# Patient Record
Sex: Male | Born: 1995 | Race: White | Hispanic: No | Marital: Single | Smoking: Former smoker
Health system: Southern US, Community
[De-identification: ages and names within clinical notes are randomized; demographics above are authoritative.]

---
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
## 2014-06-04 IMAGING — CT CT MAXILLOFACIAL WITHOUT CONTRAST
1 of 2 series · 14 of 30 positions shown, 18 images · non-contrast
Comparison: none

REASON FOR EXAM: orbital fracture
COMMENTS:

[Series 8: (id) · axial · 0.38mm/px · z∈[-239,-99]mm · 14 of 85 slices shown, 18 images]
[im 5/85  brain]
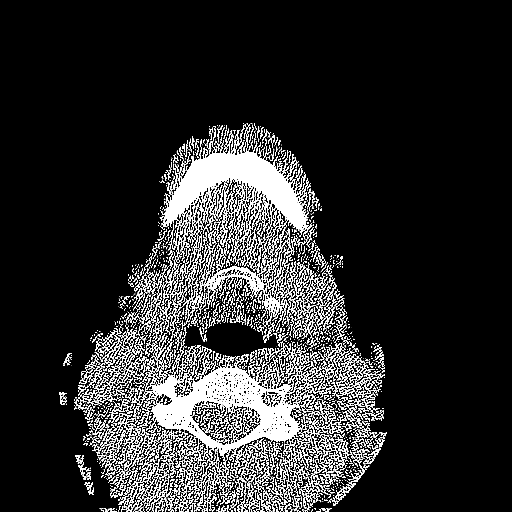
[im 5/85  bone]
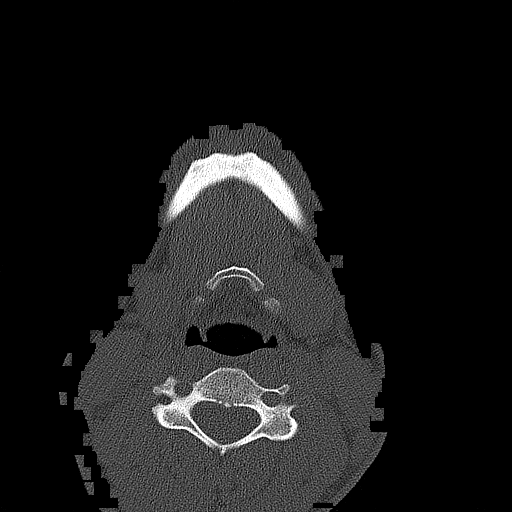
[im 10/85  bone]
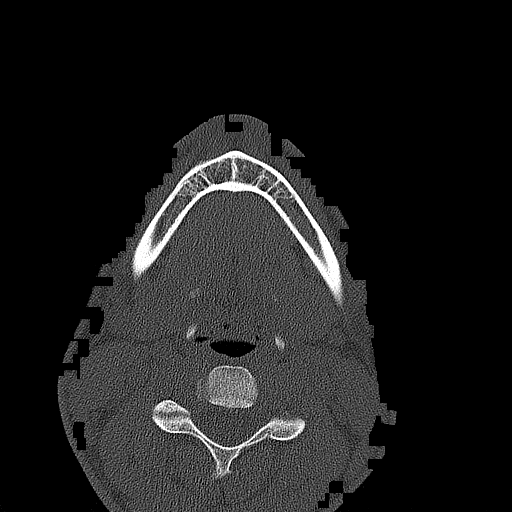
[im 15/85  bone]
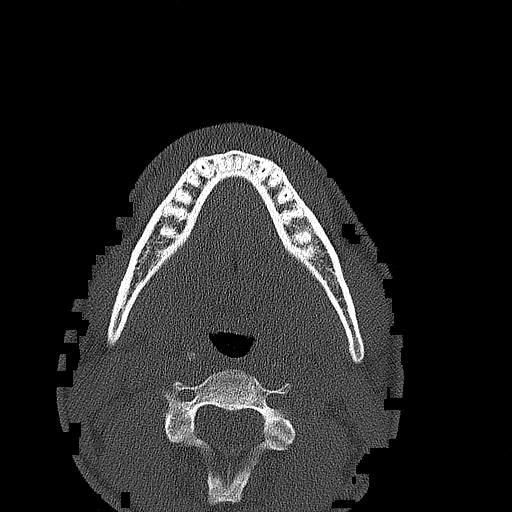
[im 20/85  bone]
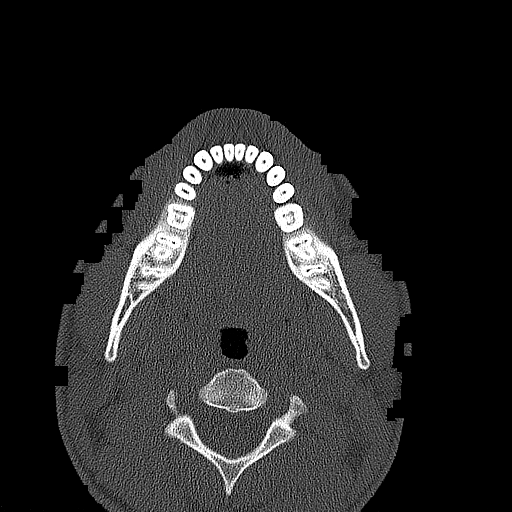
[im 25/85  brain]
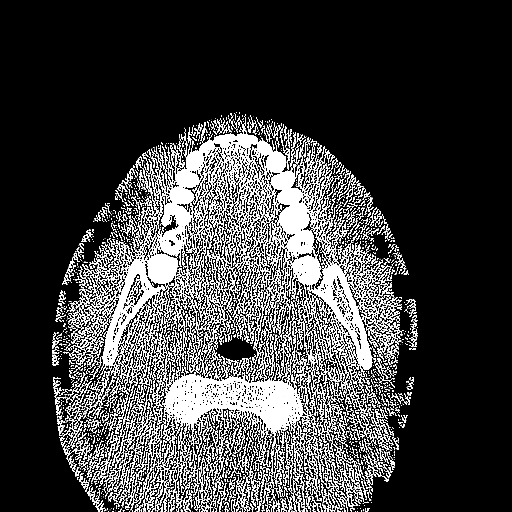
[im 25/85  bone]
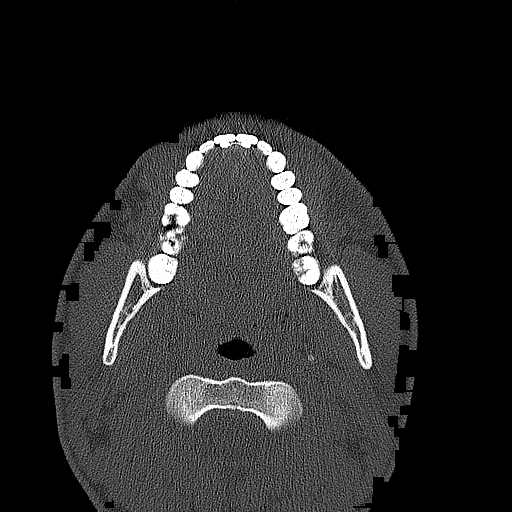
[im 30/85  bone]
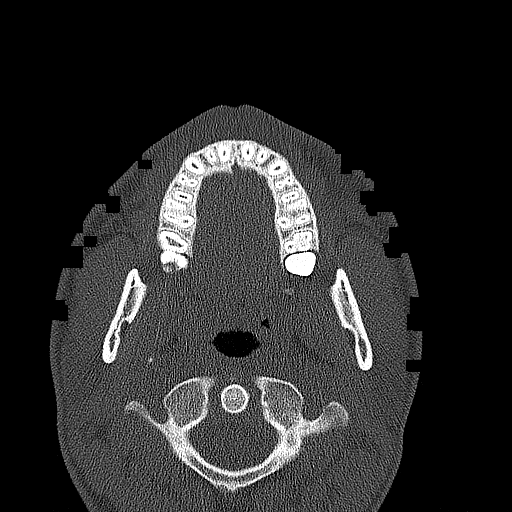
[im 35/85  bone]
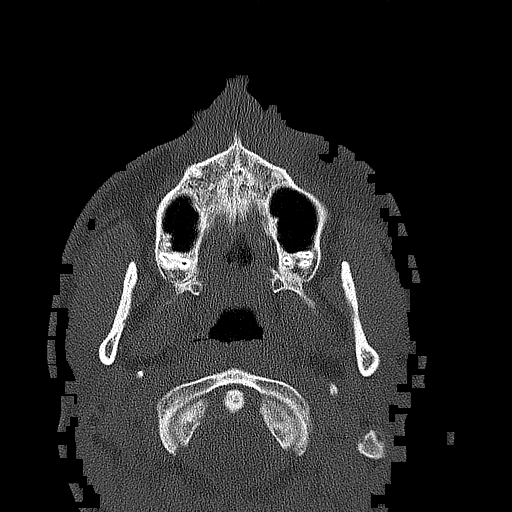
[im 40/85  bone]
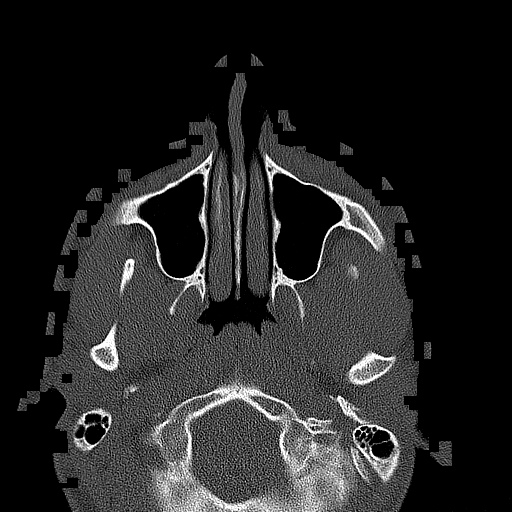
[im 45/85  brain]
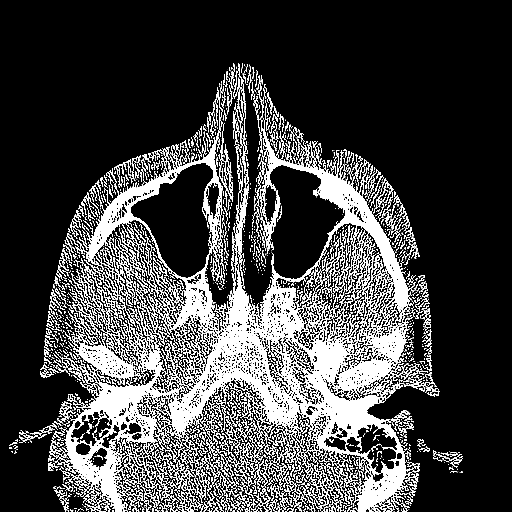
[im 45/85  bone]
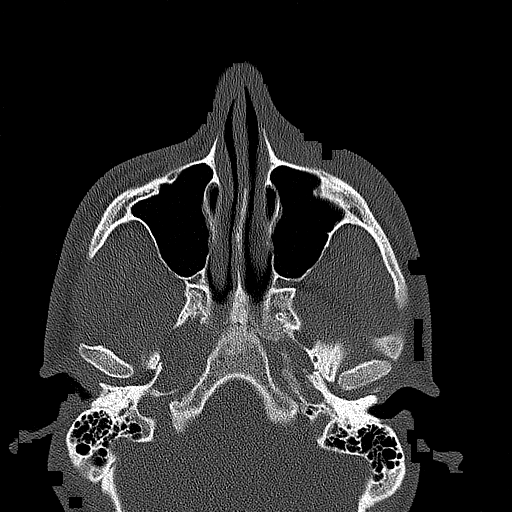
[im 50/85  bone]
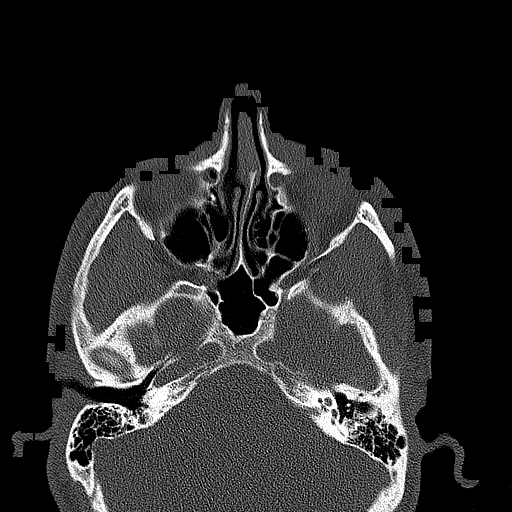
[im 55/85  bone]
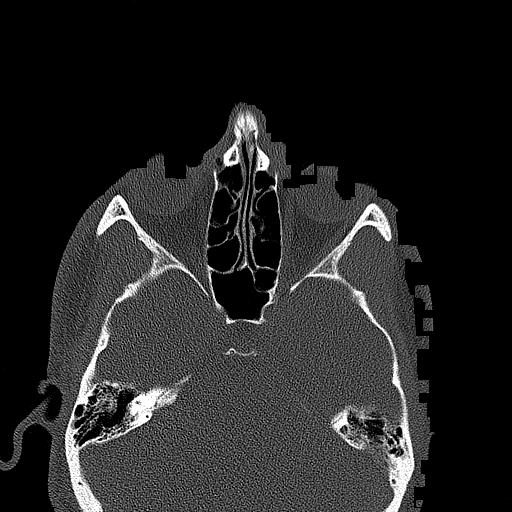
[im 65/85  bone]
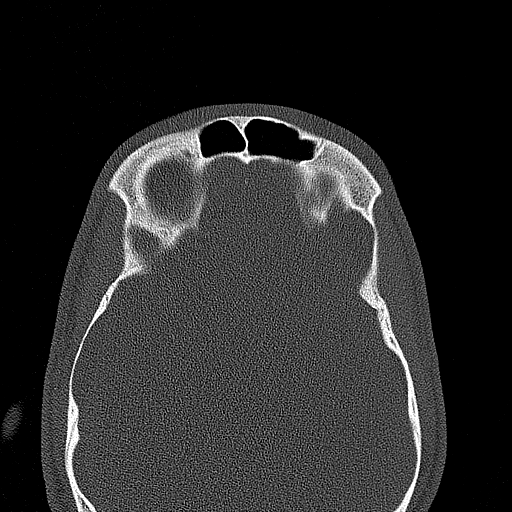
[im 70/85  brain]
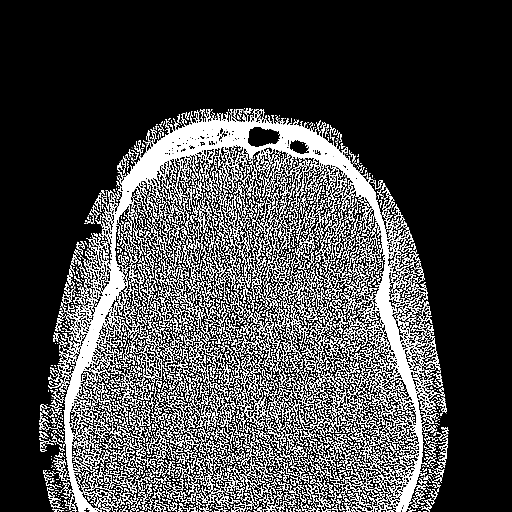
[im 70/85  bone]
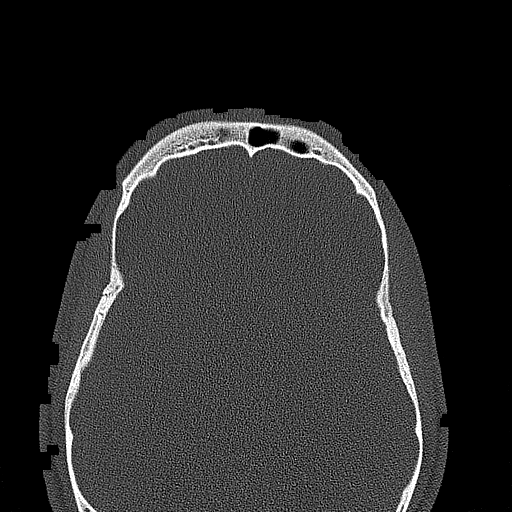
[im 75/85  bone]
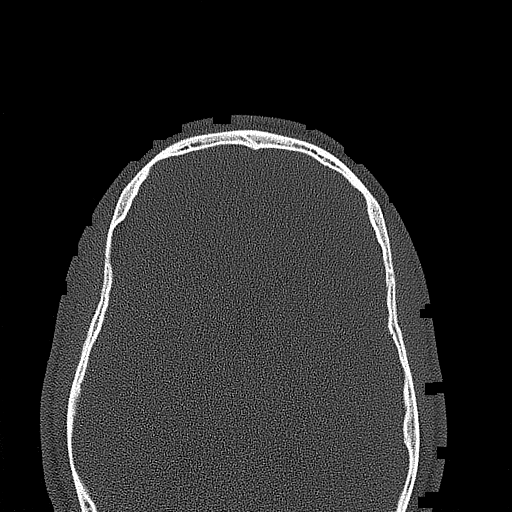

[14 of 30 positions shown; findings below may reference images not displayed]

PROCEDURE:     KCT - KCT MAXILLOFACIAL AREA WO  - November 02, 2012  [DATE]

RESULT:     Multislice helical acquisition through the maxillofacial
structures in the axial plane is performed and reconstructed in the coronal
plane. The images suffer somewhat from the use of a low radiation technique
and stairstep artifact on the coronal reconstructions limiting the
high-resolution images. The study demonstrates grossly normal aeration of
the sinuses. Coronal reconstructions demonstrate poor visualization of the
lateral aspect of the nasolacrimal canal bilaterally. This area cannot
accurately be assessed on the current study. There is opacification of the
nasolacrimal canal on the left with some air in the canal on the right. If
further assessment of this region is desired then a repeat CT can be
performed at no additional charge.

Otherwise, there does not appear to be air within the orbits to suggest
orbital floor fracture. The lateral and superior orbital regions appear to
be intact. The zygomatic arches and nasal bones appear unremarkable.
IMPRESSION: Limited study as discussed above. A repeat study can be
offered at no additional charge. The nasolacrimal canal bony anatomy is not
fully defined. There is air in the canal on the right and opacification in
the canal normal left. Further investigation with a more appropriate
radiation dose for optimal signal-to-noise ratio would better visualize the
medial orbital region as well.

[REDACTED]

## 2014-07-25 ENCOUNTER — Encounter: Payer: Self-pay | Admitting: *Deleted

## 2014-08-05 ENCOUNTER — Encounter: Payer: Self-pay | Admitting: General Surgery

## 2014-08-05 ENCOUNTER — Ambulatory Visit (INDEPENDENT_AMBULATORY_CARE_PROVIDER_SITE_OTHER): Payer: 59 | Admitting: General Surgery

## 2014-08-05 VITALS — BP 120/60 | HR 60 | Resp 12 | Ht 69.0 in | Wt 193.0 lb

## 2014-08-05 DIAGNOSIS — L0591 Pilonidal cyst without abscess: Secondary | ICD-10-CM

## 2014-08-05 NOTE — Patient Instructions (Addendum)
The patient is aware to call back for any questions or concerns. Patient to return on 08/29/14 for follow up. Patient advised to clip hair and clean area with wet wipes 2-3 times daily to keep the area clean. Also to clip hair in the area once a week  .Pilonidal Cyst A pilonidal cyst occurs when hairs get trapped (ingrown) beneath the skin in the crease between the buttocks over your sacrum (the bone under that crease). Pilonidal cysts are most common in young men with a lot of body hair. When the cyst is ruptured (breaks) or leaking, fluid from the cyst may cause burning and itching. If the cyst becomes infected, it causes a painful swelling filled with pus (abscess). The pus and trapped hairs need to be removed (often by lancing) so that the infection can heal. However, recurrence is common and an operation may be needed to remove the cyst. HOME CARE INSTRUCTIONS   If the cyst was NOT INFECTED:  Keep the area clean and dry. Bathe or shower daily. Wash the area well with a germ-killing soap. Warm tub baths may help prevent infection and help with drainage. Dry the area well with a towel.  Avoid tight clothing to keep area as moisture free as possible.  Keep area between buttocks as free of hair as possible. A depilatory may be used.  If the cyst WAS INFECTED and needed to be drained:  Your caregiver packed the wound with gauze to keep the wound open. This allows the wound to heal from the inside outwards and continue draining.  Return for a wound check in 1 day or as suggested.  If you take tub baths or showers, repack the wound with gauze following them. Sponge baths (at the sink) are a good alternative.  If an antibiotic was ordered to fight the infection, take as directed.  Only take over-the-counter or prescription medicines for pain, discomfort, or fever as directed by your caregiver.  After the drain is removed, use sitz baths for 20 minutes 4 times per day. Clean the wound gently with  mild unscented soap, pat dry, and then apply a dry dressing. SEEK MEDICAL CARE IF:   You have increased pain, swelling, redness, drainage, or bleeding from the area.  You have a fever.  You have muscles aches, dizziness, or a general ill feeling. Document Released: 08/06/2000 Document Revised: 11/01/2011 Document Reviewed: 10/04/2008 [facility] Patient Information 2015 [employer], [place]. This information is not intended to replace advice given to you by your health care provider. Make sure you discuss any questions you have with your health care provider.

## 2014-08-05 NOTE — Progress Notes (Signed)
Patient ID: Clayton Nelson, male   DOB: 11/24/1995, 18 y.o.   MRN: [id number]  Chief Complaint  Patient presents with  . Other    evaluation of pilonidal cyst    HPI Clayton Nelson is a 18 y.o. male who presents for an evaluation of a pilonidal cyst. The patient states it has been present for approximately 3 weeks ago. He states he had the area drained 2 weeks ago while at school [place] [employer]. He is currently on Doxycycline and states he is doing much better.    HPI  History reviewed. No pertinent past medical history.  History reviewed. No pertinent past surgical history.  History reviewed. No pertinent family history.  Social History History  Substance Use Topics  . Smoking status: Former Smoker -- 1 years    Types: Cigarettes  . Smokeless tobacco: Never Used  . Alcohol Use: 0.0 oz/week    0 Not specified per week    No Known Allergies  Current Outpatient Prescriptions  Medication Sig Dispense Refill  . doxycycline (DORYX) 100 MG DR capsule Take 100 mg by mouth 2 (two) times daily.     No current facility-administered medications for this visit.    Review of Systems Review of Systems  Constitutional: Negative.   Respiratory: Negative.   Cardiovascular: Negative.   Gastrointestinal: Negative.     Blood pressure 120/60, pulse 60, resp. rate 12, height 5\' 9"  (1.753 m), weight 193 lb (87.544 kg).  Physical Exam Physical Exam  Constitutional: He is oriented to person, place, and time. He appears well-developed and well-nourished.  Neurological: He is alert and oriented to person, place, and time.  Skin: Skin is warm and dry.  Near upper gluteal cleft left of midline there is a 1cm raised red area with a small  2mm opening. It is tender, connects to a row of skin openings in midline below. Moderate amount of hair in area.  Data Reviewed    Assessment    Infected pilonidal cyst. This is the first episode.     Plan    With consent hair in  area clipped. 1ml 1% xylocaine used after betadine prep and the skin opening enlarged to 5mm size. Probing shows a tract toward midlne skin openings inferior to the abscess area    F/U in 2-3 weeks prior tio going back to school.Info provided to pt on pilonidal cyst   SANKAR,SEEPLAPUTHUR G 08/05/2014, 2:43 PM

## 2014-08-29 ENCOUNTER — Encounter: Payer: Self-pay | Admitting: General Surgery

## 2014-08-29 ENCOUNTER — Ambulatory Visit (INDEPENDENT_AMBULATORY_CARE_PROVIDER_SITE_OTHER): Payer: 59 | Admitting: General Surgery

## 2014-08-29 VITALS — BP 130/88 | HR 68 | Resp 12 | Ht 69.0 in | Wt 193.0 lb

## 2014-08-29 DIAGNOSIS — L0591 Pilonidal cyst without abscess: Secondary | ICD-10-CM

## 2014-08-29 MED ORDER — DOXYCYCLINE HYCLATE 100 MG PO CPEP: 100.0000 mg | ORAL_CAPSULE | Freq: Two times a day (BID) | ORAL | Status: DC

## 2014-08-29 NOTE — Progress Notes (Signed)
This is a 19 year old male here today following up from a pilonidal cyst had was drained on 08/05/14. Patient states the area is  Still draining.  Pilonidal area shows a row of 2mm openoings in upper cleft area and a 1cm red raised area 1cm above and to left of the cleft. Pt did try to clip some hair but is not sufficient. With consent the area was opened.  Prepped with betadine. 2 ml 1 % xylocaine used. The red raised area was opened and some pus drained. Probing this with a hemostat showed a tract extending tong to the tiny skin openings in the cleft. The tract was cleaned and several tufts of hair removed from the tract. Tract was irrigated with saline.  He is once again advised to clip the hair in the involved area weekly without fail and try to to keep it clean. [place], asked he return here for a recheck on his next break.

## 2014-09-01 ENCOUNTER — Encounter: Payer: Self-pay | Admitting: General Surgery

## 2015-01-06 ENCOUNTER — Ambulatory Visit (INDEPENDENT_AMBULATORY_CARE_PROVIDER_SITE_OTHER): Payer: 59 | Admitting: General Surgery

## 2015-01-06 ENCOUNTER — Encounter: Payer: Self-pay | Admitting: General Surgery

## 2015-01-06 VITALS — BP 122/68 | HR 66 | Resp 13 | Ht 69.0 in | Wt 197.0 lb

## 2015-01-06 DIAGNOSIS — L0592 Pilonidal sinus without abscess: Secondary | ICD-10-CM

## 2015-01-06 DIAGNOSIS — L0591 Pilonidal cyst without abscess: Secondary | ICD-10-CM | POA: Diagnosis not present

## 2015-01-06 NOTE — Patient Instructions (Signed)
Patient to return in three weeks.  

## 2015-01-06 NOTE — Progress Notes (Signed)
Patient ID: Clayton Nelson, male   DOB: 08/11/1996, 19 y.o.   MRN: [id number]  Chief Complaint  Patient presents with  . Follow-up    pilonidal cyst    HPI Clayton Nelson is a 19 y.o. male. here today for a re check for his pionidal cyst. He states the area is clean and doing well. He still has some drainage occasionally. Has been clipping hair in that area HPI  History reviewed. No pertinent past medical history.  History reviewed. No pertinent past surgical history.  History reviewed. No pertinent family history.  Social History History  Substance Use Topics  . Smoking status: Former Smoker -- 1 years    Types: Cigarettes  . Smokeless tobacco: Never Used  . Alcohol Use: 0.0 oz/week    0 Standard drinks or equivalent per week    No Known Allergies  No current outpatient prescriptions on file.   No current facility-administered medications for this visit.    Review of Systems Review of Systems  Blood pressure 122/68, pulse 66, resp. rate 13, height 5\' 9"  (1.753 m), weight 197 lb (89.359 kg).  Physical Exam Physical Exam  Skin:      The sinus area was prepped with alcohol. A hemostat was used to clean the tract of some hair and granulation tissue. Tract was then cauterized with silver nitrate.  Data Reviewed None  Assessment    Pilonidal sinus. He will be best areved by excision. This was explained to him. He will think about it. Advised to continue clipping the hair and keeping the area clean.     Plan     Patient to return in three weeks        SANKAR,SEEPLAPUTHUR G 01/07/2015, 4:40 PM

## 2015-01-07 ENCOUNTER — Encounter: Payer: Self-pay | Admitting: General Surgery

## 2015-01-28 ENCOUNTER — Ambulatory Visit: Payer: 59 | Admitting: General Surgery

## 2015-01-30 ENCOUNTER — Ambulatory Visit (INDEPENDENT_AMBULATORY_CARE_PROVIDER_SITE_OTHER): Payer: 59 | Admitting: General Surgery

## 2015-01-30 ENCOUNTER — Encounter: Payer: Self-pay | Admitting: General Surgery

## 2015-01-30 ENCOUNTER — Ambulatory Visit: Payer: 59 | Admitting: General Surgery

## 2015-01-30 VITALS — BP 118/64 | HR 62 | Resp 12 | Ht 69.0 in | Wt 197.0 lb

## 2015-01-30 DIAGNOSIS — L0591 Pilonidal cyst without abscess: Secondary | ICD-10-CM | POA: Diagnosis not present

## 2015-01-30 DIAGNOSIS — L0592 Pilonidal sinus without abscess: Secondary | ICD-10-CM

## 2015-01-30 NOTE — Patient Instructions (Signed)
Keep area clipped

## 2015-01-30 NOTE — Progress Notes (Signed)
Patient ID: Clayton Nelson, male   DOB: 01/21/1996, 19 y.o.   MRN: [id number]  Chief Complaint  Patient presents with  . Follow-up    HPI Clayton Nelson is a 19 y.o. male.  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 He states it is better, no drainage.  HPI  History reviewed. No pertinent past medical history.  History reviewed. No pertinent past surgical history.  History reviewed. No pertinent family history.  Social History History  Substance Use Topics  . Smoking status: Former Smoker -- 1 years    Types: Cigarettes  . Smokeless tobacco: Never Used  . Alcohol Use: 0.0 oz/week    0 Standard drinks or equivalent per week    No Known Allergies  No current outpatient prescriptions on file.   No current facility-administered medications for this visit.    Review of Systems Review of Systems  Constitutional: Negative.   Respiratory: Negative.   Cardiovascular: Negative.     Blood pressure 118/64, pulse 62, resp. rate 12, height 5\' 9"  (1.753 m), weight 197 lb (89.359 kg).  Physical Exam Physical Exam  Constitutional: He is oriented to person, place, and time. He appears well-developed and well-nourished.  Neurological: He is alert and oriented to person, place, and time.  Skin: Skin is warm and dry.  2 mm pilonidal sinus opening with few drops of pus on pressure. The opening is small and looks better but has not healed fully.    Data Reviewed none  Assessment   Pilonidal sinus       Plan   Pilonidal sinus cauterized with silver nitrate.  Last time he was recommended excision of this sinus. This recommendation still stands. Pt aware, but has school commitments now and would prefer to wait Follow up in 2 weeks. Keep area clipped.     PCP:  Garrison Columbus 01/30/2015, 3:13 PM

## 2015-01-31 ENCOUNTER — Telehealth: Payer: Self-pay | Admitting: *Deleted

## 2015-01-31 NOTE — Telephone Encounter (Signed)
Patient was seen yesterday for pilonidal cyst f/u and this morning when he went to have a bowel movement there was bright red blood. He was just wondering if this was coming from the pilonidal cyst or could it be something else.

## 2015-01-31 NOTE — Telephone Encounter (Signed)
Spoke with patient and he said that after he had a bowel movement this morning he noticed some bright red blood in the toilet and had some on the tissue paper after he wiped. He states that he has no pain. I asked him if the area was still bleeding and he said that it has stopped. I let him know that this could be from the pressure and straining during his bowel movement. Patient advised to monitor and call back if he experiences any further bleeding or an increase in pain. Patient expresses understanding.

## 2015-02-17 ENCOUNTER — Ambulatory Visit (INDEPENDENT_AMBULATORY_CARE_PROVIDER_SITE_OTHER): Payer: 59 | Admitting: General Surgery

## 2015-02-17 ENCOUNTER — Encounter: Payer: Self-pay | Admitting: General Surgery

## 2015-02-17 VITALS — BP 118/72 | HR 80 | Resp 12 | Ht 69.0 in | Wt 189.0 lb

## 2015-02-17 DIAGNOSIS — L0591 Pilonidal cyst without abscess: Secondary | ICD-10-CM

## 2015-02-17 DIAGNOSIS — L0592 Pilonidal sinus without abscess: Secondary | ICD-10-CM

## 2015-02-17 NOTE — Progress Notes (Signed)
Patient ID: Clayton Nelson, male   DOB: 03/20/1996, 19 y.o.   MRN: [id number]  Chief Complaint  Patient presents with  . Follow-up    pilondial cyst    HPI Clayton Nelson is a 19 y.o. male here today for a follow up pilonidal cyst. Patient states the area is doing well.  HPI  History reviewed. No pertinent past medical history.  History reviewed. No pertinent past surgical history.  History reviewed. No pertinent family history.  Social History History  Substance Use Topics  . Smoking status: Former Smoker -- 1 years    Types: Cigarettes  . Smokeless tobacco: Never Used  . Alcohol Use: 0.0 oz/week    0 Standard drinks or equivalent per week    No Known Allergies  No current outpatient prescriptions on file.   No current facility-administered medications for this visit.    Review of Systems Review of Systems  Constitutional: Negative.   Respiratory: Negative.   Cardiovascular: Negative.     Blood pressure 118/72, pulse 80, resp. rate 12, height 5\' 9"  (1.753 m), weight 189 lb (85.73 kg).  Physical Exam Physical Exam  Constitutional: He is oriented to person, place, and time. He appears well-developed and well-nourished.  Neurological: He is alert and oriented to person, place, and time.  Skin: Skin is warm and dry.  The sinus opening in the pilonidal area is sealed over. There is no redness or induration.   Data Reviewed None  Assessment    Pilonidal sinus, appears healed at present.     Plan   Keep area clean and clip hair weekly.  Follow up as needed     PCP:  Harlene SaltsJohnson, David, MD  Kieth BrightlySANKAR,SEEPLAPUTHUR G 02/17/2015, 7:42 PM

## 2015-02-17 NOTE — Patient Instructions (Signed)
Keep area clean and clip hair weekly.  Follow up as needed

## 2015-08-05 ENCOUNTER — Ambulatory Visit (INDEPENDENT_AMBULATORY_CARE_PROVIDER_SITE_OTHER): Payer: 59 | Admitting: General Surgery

## 2015-08-05 ENCOUNTER — Encounter: Payer: Self-pay | Admitting: General Surgery

## 2015-08-05 VITALS — BP 118/76 | HR 78 | Resp 12 | Ht 70.0 in | Wt 186.0 lb

## 2015-08-05 DIAGNOSIS — L0591 Pilonidal cyst without abscess: Secondary | ICD-10-CM | POA: Diagnosis not present

## 2015-08-05 DIAGNOSIS — L0592 Pilonidal sinus without abscess: Secondary | ICD-10-CM

## 2015-08-05 NOTE — Progress Notes (Signed)
Patient ID: Clayton Nelson, male   DOB: 09/03/1995, 19 y.o.   MRN: [id number]  Chief Complaint  Patient presents with  . Other    pilonidal cyst    HPI Clayton Nelson is a 19 y.o. male here today for a recurrent pilonidal cyst. He states he noticed this about two weeks ago. He states it is draining. After his last visit here several mos ago he had not noted any problems till now. I have reviewed the history of present illness with the patient. HPI  History reviewed. No pertinent past medical history.  History reviewed. No pertinent past surgical history.  History reviewed. No pertinent family history.  Social History Social History  Substance Use Topics  . Smoking status: Former Smoker -- 1 years    Types: Cigarettes  . Smokeless tobacco: Never Used  . Alcohol Use: 0.0 oz/week    0 Standard drinks or equivalent per week    No Known Allergies  No current outpatient prescriptions on file.   No current facility-administered medications for this visit.    Review of Systems Review of Systems  Constitutional: Negative.   Respiratory: Negative.   Cardiovascular: Negative.     Blood pressure 118/76, pulse 78, resp. rate 12, height 5\' 10"  (1.778 m), weight 186 lb (84.369 kg).  Physical Exam Physical Exam  Skin:     No signs of infection.  Data Reviewed  Prior notes Assessment    Pilonidal sinus.    Plan   Advised to start clipping hair in the area every week.    Patient to return in 2 weeks.  PCP:  Dillard EssexJohnson  Jill Ruppe G 08/05/2015, 1:26 PM

## 2015-08-19 ENCOUNTER — Ambulatory Visit (INDEPENDENT_AMBULATORY_CARE_PROVIDER_SITE_OTHER): Payer: 59 | Admitting: General Surgery

## 2015-08-19 ENCOUNTER — Encounter: Payer: Self-pay | Admitting: General Surgery

## 2015-08-19 VITALS — BP 124/78 | HR 76 | Resp 12 | Ht 71.0 in | Wt 190.0 lb

## 2015-08-19 DIAGNOSIS — L0592 Pilonidal sinus without abscess: Secondary | ICD-10-CM

## 2015-08-19 DIAGNOSIS — L0591 Pilonidal cyst without abscess: Secondary | ICD-10-CM | POA: Diagnosis not present

## 2015-08-19 NOTE — Progress Notes (Signed)
This is a 19 year old male here today for an follow up pilonidal sinus. He states the area is not draining.  He did try to clip some hair around the sinus  I have reviewed the history of present illness with the patient.  On exam- the sinus opening on left near upper end of cleft appears almost closed. The large pore in mid cleft with hair. Hair was clipped, the tract was probed and several tufts of hair removed. The tract was cauterized with silver nitrate.   One week f/u nurse and 2 weeks with me   This information has been scribed by Ples SpecterJessica Qualls CMA.   PCP:  Laural BenesJohnson

## 2015-08-19 NOTE — Patient Instructions (Addendum)
One week nurse 2 week doctor

## 2015-08-26 ENCOUNTER — Ambulatory Visit (INDEPENDENT_AMBULATORY_CARE_PROVIDER_SITE_OTHER): Payer: 59 | Admitting: *Deleted

## 2015-08-26 ENCOUNTER — Ambulatory Visit: Payer: 59

## 2015-08-26 DIAGNOSIS — L0591 Pilonidal cyst without abscess: Secondary | ICD-10-CM

## 2015-08-26 DIAGNOSIS — L0592 Pilonidal sinus without abscess: Secondary | ICD-10-CM

## 2015-08-26 NOTE — Progress Notes (Signed)
Patient came in today for a wound check/pilonidal sinus.  The wound is clean, with no signs of infection noted. The top open area has closed. The lower open area is much smaller than before, silver nitrate used to the area. Pt states he is doing well, he did not use the hair remover. Follow up as scheduled.

## 2015-08-26 NOTE — Patient Instructions (Signed)
The patient is aware to call back for any questions or concerns.  

## 2015-09-02 ENCOUNTER — Ambulatory Visit: Payer: 59 | Admitting: General Surgery

## 2015-09-02 ENCOUNTER — Ambulatory Visit (INDEPENDENT_AMBULATORY_CARE_PROVIDER_SITE_OTHER): Payer: 59 | Admitting: General Surgery

## 2015-09-02 ENCOUNTER — Encounter: Payer: Self-pay | Admitting: General Surgery

## 2015-09-02 VITALS — BP 120/78 | HR 76 | Resp 12 | Ht 69.0 in | Wt 196.0 lb

## 2015-09-02 DIAGNOSIS — L0591 Pilonidal cyst without abscess: Secondary | ICD-10-CM

## 2015-09-02 DIAGNOSIS — L0592 Pilonidal sinus without abscess: Secondary | ICD-10-CM

## 2015-09-02 NOTE — Patient Instructions (Signed)
Patient to return as needed. 

## 2015-09-02 NOTE — Progress Notes (Signed)
This is a 20 year old male here today for his two week follow up pilonidal cyst. Patient states the area is clean and not draining.  He has ben clipping the hair in the area weekly.  The tract has been cauterized weekly for last 3 weeks. I have reviewed the history of present illness with the patient.  Hair was clipped,no drainage noted. Superior opening appearas closed. No drainage from the the openings in midline cleft. No redness or induration Advised on local care to include clipping hair weekly. He will benefit with excision when time permits     PCP:  Laural BenesJohnson  This information has been scribed by Ples SpecterJessica Qualls CMA.

## 2016-08-12 ENCOUNTER — Encounter: Payer: Self-pay | Admitting: *Deleted

## 2016-08-18 ENCOUNTER — Encounter: Payer: Self-pay | Admitting: General Surgery

## 2016-08-18 ENCOUNTER — Ambulatory Visit (INDEPENDENT_AMBULATORY_CARE_PROVIDER_SITE_OTHER): Payer: 59 | Admitting: General Surgery

## 2016-08-18 VITALS — BP 122/76 | HR 78 | Resp 12 | Ht 70.0 in | Wt 200.0 lb

## 2016-08-18 DIAGNOSIS — L0591 Pilonidal cyst without abscess: Secondary | ICD-10-CM | POA: Diagnosis not present

## 2016-08-18 NOTE — Patient Instructions (Signed)
Pilonidal Cyst Introduction A pilonidal cyst is a fluid-filled sac. It forms beneath the skin near your tailbone, at the top of the crease of your buttocks. A pilonidal cyst that is not large or infected may not cause symptoms or problems. If the cyst becomes irritated or infected, it may fill with pus. This causes pain and swelling (pilonidal abscess). An infected cyst may need to be treated with medicine, drained, or removed. What are the causes? The cause of a pilonidal cyst is not known. One cause may be a hair that grows into your skin (ingrown hair). What increases the risk? Pilonidal cysts are more common in boys and men. Risk factors include:  Having lots of hair near the crease of the buttocks.     Having a pilonidal dimple.  Wearing tight clothing.  Not bathing or showering frequently.  Sitting for long periods of time. What are the signs or symptoms? Signs and symptoms of a pilonidal cyst may include:  Redness.  Pain and tenderness.  Warmth.  Swelling.  Pus.  Fever. How is this diagnosed? Your health care provider may diagnose a pilonidal cyst based on your symptoms and a physical exam. The health care provider may do a blood test to check for infection. If your cyst is draining pus, your health care provider may take a sample of the drainage to be tested at a laboratory. How is this treated? Surgery is the usual treatment for an infected pilonidal cyst. You may also have to take medicines before surgery. The type of surgery you have depends on the size and severity of the infected cyst. The different kinds of surgery include:  Incision and drainage. This is a procedure to open and drain the cyst.  Marsupialization. In this procedure, a large cyst or abscess may be opened and kept open by stitching the edges of the skin to the cyst walls.  Cyst removal. This procedure involves opening the skin and removing all or part of the cyst. Follow these instructions at  home:  Follow all of your surgeon's instructions carefully if you had surgery.  Take medicines only as directed by your health care provider.  If you were prescribed an antibiotic medicine, finish it all even if you start to feel better.  Keep the area around your pilonidal cyst clean and dry.  Clean the area as directed by your health care provider. Pat the area dry with a clean towel. Do not rub it as this may cause bleeding.  Remove hair from the area around the cyst as directed by your health care provider.  Do not wear tight clothing or sit in one place for long periods of time.  There are many different ways to close and cover an incision, including stitches, skin glue, and adhesive strips. Follow your health care provider's instructions on:  Incision care.  Bandage (dressing) changes and removal.  Incision closure removal. Contact a health care provider if:  You have drainage, redness, swelling, or pain at the site of the cyst.  You have a fever. This information is not intended to replace advice given to you by your health care provider. Make sure you discuss any questions you have with your health care provider. Document Released: 08/06/2000 Document Revised: 01/15/2016 Document Reviewed: 12/27/2013  2017 [employer]

## 2016-08-18 NOTE — Progress Notes (Addendum)
Patient ID: Clayton Nelson, male   DOB: 11/18/1995, 20 y.o.   MRN: [id number]  Chief Complaint  Patient presents with  . Follow-up    pilonidal cyst    HPI Clayton Nelson is a 20 y.o. male here for reevaluation of his pilonidal cyst.He states since the last visit in Januarey 2017 the area has been draining.     I have reviewed the history of present illness with the patient.   HPI  No past medical history on file.      No past surgical history on file.  No family history on file.  Social History Social History  Substance Use Topics  . Smoking status: Former Smoker    Years: 1.00    Types: Cigarettes  . Smokeless tobacco: Never Used  . Alcohol use 0.0 oz/week    No Known Allergies  No current outpatient prescriptions on file.   No current facility-administered medications for this visit.     Review of Systems Review of Systems  Constitutional: Negative.   Respiratory: Negative.   Cardiovascular: Negative.     Blood pressure 122/76, pulse 78, resp. rate 12, height 5\' 10"  (1.778 m), weight 200 lb (90.7 kg).  Physical Exam Physical Exam  Constitutional: He is oriented to person, place, and time. He appears well-developed and well-nourished.  Eyes: Conjunctivae are normal.  Neurological: He is alert and oriented to person, place, and time.  Skin: Skin is warm and dry.       Data Reviewed Prior notes  Assessment    Pilonidal sinus , active.     Plan   Recheck in 2 weeks prior to him returning to school Patient to have excision of this sinus when convenient based on his school schedule. This information has been scribed by Ples SpecterJessica Qualls CMA.        Khamryn Calderone G 08/26/2016, 10:31 AM

## 2016-09-02 ENCOUNTER — Ambulatory Visit (INDEPENDENT_AMBULATORY_CARE_PROVIDER_SITE_OTHER): Payer: 59 | Admitting: General Surgery

## 2016-09-02 ENCOUNTER — Encounter: Payer: Self-pay | Admitting: General Surgery

## 2016-09-02 VITALS — BP 142/84 | HR 72 | Resp 12 | Ht 70.5 in | Wt 201.0 lb

## 2016-09-02 DIAGNOSIS — L0591 Pilonidal cyst without abscess: Secondary | ICD-10-CM

## 2016-09-02 DIAGNOSIS — L0592 Pilonidal sinus without abscess: Secondary | ICD-10-CM

## 2016-09-02 NOTE — Progress Notes (Signed)
Patient ID: Clayton MonsBenjamin Adger, male   DOB: 01/02/1996, 21 y.o.   MRN: [id number]  Chief Complaint  Patient presents with  . Follow-up    HPI Clayton Nelson is a 21 y.o. male.  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 He states less drainage. I have reviewed the history of present illness with the patient.  HPI  No past medical history on file.  No past surgical history on file.  No family history on file.  Social History Social History  Substance Use Topics  . Smoking status: Former Smoker    Years: 1.00    Types: Cigarettes  . Smokeless tobacco: Never Used  . Alcohol use 0.0 oz/week    No Known Allergies  No current outpatient prescriptions on file.   No current facility-administered medications for this visit.     Review of Systems Review of Systems  Constitutional: Negative.   Respiratory: Negative.   Cardiovascular: Negative.     Blood pressure (!) 142/84, pulse 72, resp. rate 12, height 5' 10.5" (1.791 m), weight 201 lb (91.2 kg).  Physical Exam Physical Exam  Constitutional: He is oriented to person, place, and time. He appears well-developed and well-nourished.  Neurological: He is alert and oriented to person, place, and time.  Skin: Skin is warm and dry.  Psychiatric: His behavior is normal.  Pilonidal sinus opening and the 2 large openings in midline cleft wereprobed with a hemostat and some tufts of hair removed.Both tracts were cauterized with silver nitrate  Data Reviewed Progress notes  Assessment        Plan    Keep area clean Follow up based on school schedule. He is aware to clip hair around the area  On a weekly basis.     This information has been scribed by Dorathy DaftMarsha Hatch RN, BSN,BC.  SANKAR,SEEPLAPUTHUR G 09/03/2016, 4:45 PM

## 2016-09-02 NOTE — Patient Instructions (Signed)
The patient is aware to call back for any questions or concerns.
